# Patient Record
Sex: Male | Born: 1970 | Race: White | Hispanic: No | Marital: Single | Smoking: Current every day smoker
Health system: Southern US, Community
[De-identification: ages and names within clinical notes are randomized; demographics above are authoritative.]

## PROBLEM LIST (undated history)

## (undated) DIAGNOSIS — T7840XA Allergy, unspecified, initial encounter: Secondary | ICD-10-CM

## (undated) DIAGNOSIS — M1990 Unspecified osteoarthritis, unspecified site: Secondary | ICD-10-CM

## (undated) HISTORY — DX: Unspecified osteoarthritis, unspecified site: M19.90

## (undated) HISTORY — PX: APPENDECTOMY: SHX54

## (undated) HISTORY — DX: Allergy, unspecified, initial encounter: T78.40XA

## (undated) HISTORY — PX: CHOLECYSTECTOMY: SHX55

## (undated) HISTORY — PX: HIATAL HERNIA REPAIR: SHX195

---

## 2004-10-01 ENCOUNTER — Ambulatory Visit: Payer: Self-pay | Admitting: Internal Medicine

## 2005-01-02 ENCOUNTER — Ambulatory Visit: Payer: Self-pay | Admitting: Internal Medicine

## 2007-12-02 ENCOUNTER — Ambulatory Visit: Payer: Self-pay | Admitting: Gastroenterology

## 2007-12-02 LAB — CONVERTED CEMR LAB
ALT: 71 units/L — ABNORMAL HIGH (ref 0–53)
Basophils Absolute: 0.1 10*3/uL (ref 0.0–0.1)
Bilirubin, Direct: 0.1 mg/dL (ref 0.0–0.3)
CO2: 34 meq/L — ABNORMAL HIGH (ref 19–32)
Calcium: 10.3 mg/dL (ref 8.4–10.5)
Eosinophils Absolute: 0.6 10*3/uL (ref 0.0–0.6)
GFR calc Af Amer: 97 mL/min
Glucose, Bld: 104 mg/dL — ABNORMAL HIGH (ref 70–99)
HCT: 47.4 % (ref 39.0–52.0)
Hemoglobin: 16.3 g/dL (ref 13.0–17.0)
Lymphocytes Relative: 28.9 % (ref 12.0–46.0)
MCHC: 34.3 g/dL (ref 30.0–36.0)
MCV: 86.9 fL (ref 78.0–100.0)
Monocytes Absolute: 0.8 10*3/uL — ABNORMAL HIGH (ref 0.2–0.7)
Neutro Abs: 4.3 10*3/uL (ref 1.4–7.7)
Neutrophils Relative %: 52.5 % (ref 43.0–77.0)
Potassium: 4.6 meq/L (ref 3.5–5.1)
Saturation Ratios: 18.4 % — ABNORMAL LOW (ref 20.0–50.0)
Sodium: 143 meq/L (ref 135–145)
Total Protein: 7.4 g/dL (ref 6.0–8.3)

## 2007-12-08 ENCOUNTER — Ambulatory Visit: Payer: Self-pay | Admitting: Gastroenterology

## 2007-12-08 LAB — CONVERTED CEMR LAB: Hepatitis B Surface Ag: NEGATIVE

## 2007-12-14 ENCOUNTER — Ambulatory Visit: Payer: Self-pay | Admitting: Gastroenterology

## 2007-12-14 ENCOUNTER — Encounter: Payer: Self-pay | Admitting: Gastroenterology

## 2007-12-14 DIAGNOSIS — K449 Diaphragmatic hernia without obstruction or gangrene: Secondary | ICD-10-CM | POA: Insufficient documentation

## 2007-12-14 DIAGNOSIS — K210 Gastro-esophageal reflux disease with esophagitis: Secondary | ICD-10-CM

## 2007-12-14 LAB — CONVERTED CEMR LAB
Cholesterol: 212 mg/dL (ref 0–200)
HDL: 26.6 mg/dL — ABNORMAL LOW (ref >39.0)
VLDL: 34 mg/dL (ref 0–40)

## 2007-12-17 ENCOUNTER — Emergency Department (HOSPITAL_COMMUNITY): Admission: EM | Admit: 2007-12-17 | Discharge: 2007-12-17 | Payer: Self-pay | Admitting: Emergency Medicine

## 2007-12-19 ENCOUNTER — Ambulatory Visit (HOSPITAL_COMMUNITY): Admission: RE | Admit: 2007-12-19 | Discharge: 2007-12-19 | Payer: Self-pay | Admitting: Gastroenterology

## 2007-12-30 ENCOUNTER — Ambulatory Visit: Payer: Self-pay | Admitting: Gastroenterology

## 2007-12-30 LAB — CONVERTED CEMR LAB
ALT: 53 units/L (ref 0–53)
AST: 27 units/L (ref 0–37)
Bilirubin, Direct: 0.1 mg/dL (ref 0.0–0.3)
Total Protein: 6.8 g/dL (ref 6.0–8.3)

## 2008-01-12 ENCOUNTER — Ambulatory Visit: Payer: Self-pay | Admitting: Gastroenterology

## 2008-03-06 DIAGNOSIS — E7800 Pure hypercholesterolemia, unspecified: Secondary | ICD-10-CM

## 2008-03-06 DIAGNOSIS — K828 Other specified diseases of gallbladder: Secondary | ICD-10-CM

## 2008-03-06 DIAGNOSIS — K7689 Other specified diseases of liver: Secondary | ICD-10-CM

## 2008-11-10 IMAGING — CR DG KNEE COMPLETE 4+V*L*
6 series · 6 of 6 positions shown · non-contrast
Comparison: none

CLINICAL DATA: Pain.   
 LEFT KNEE ? 4 VIEW:

[t knee ap left *]
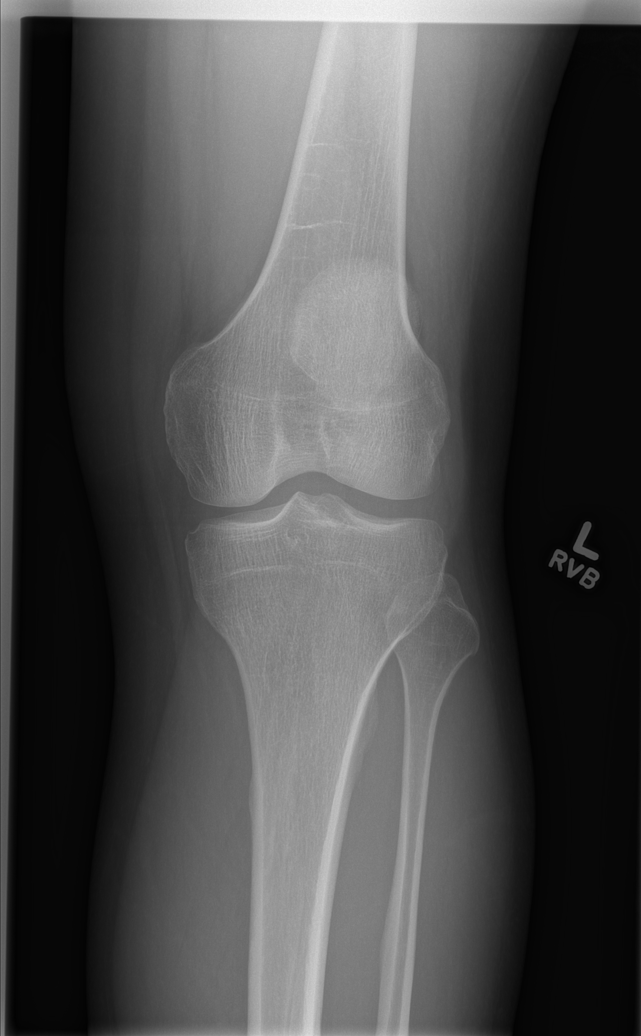

[t knee oblique left * (1 of 3)]
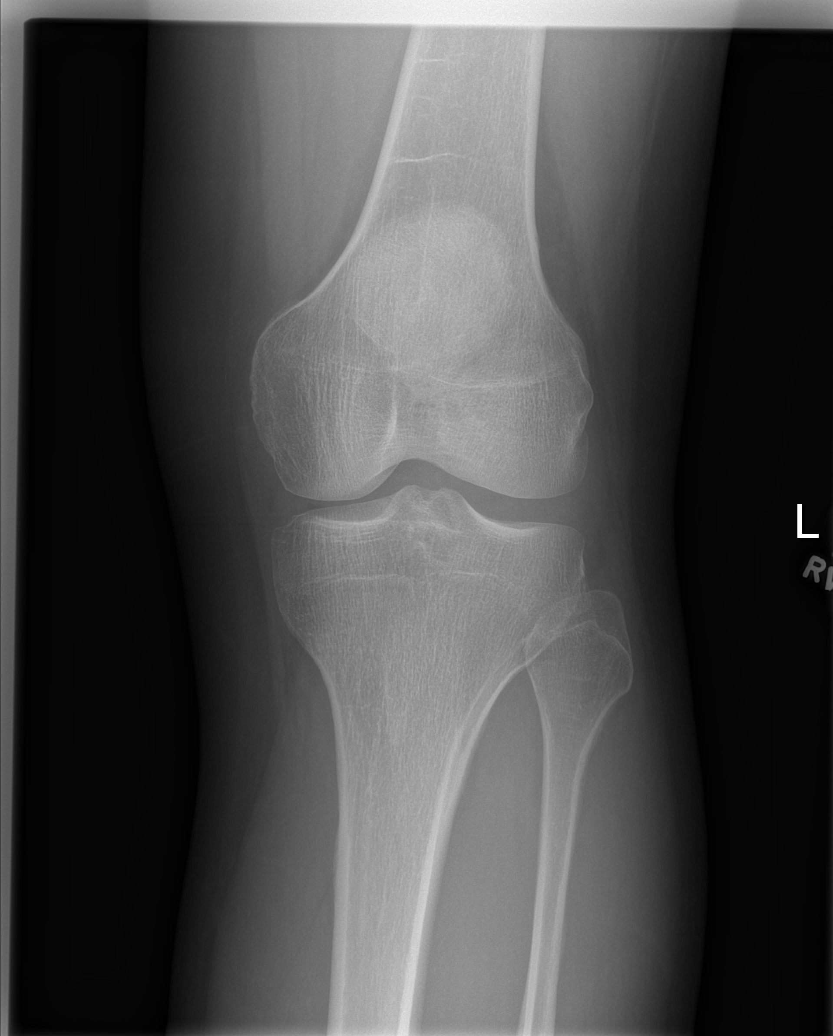

[t knee oblique left * (2 of 3)]
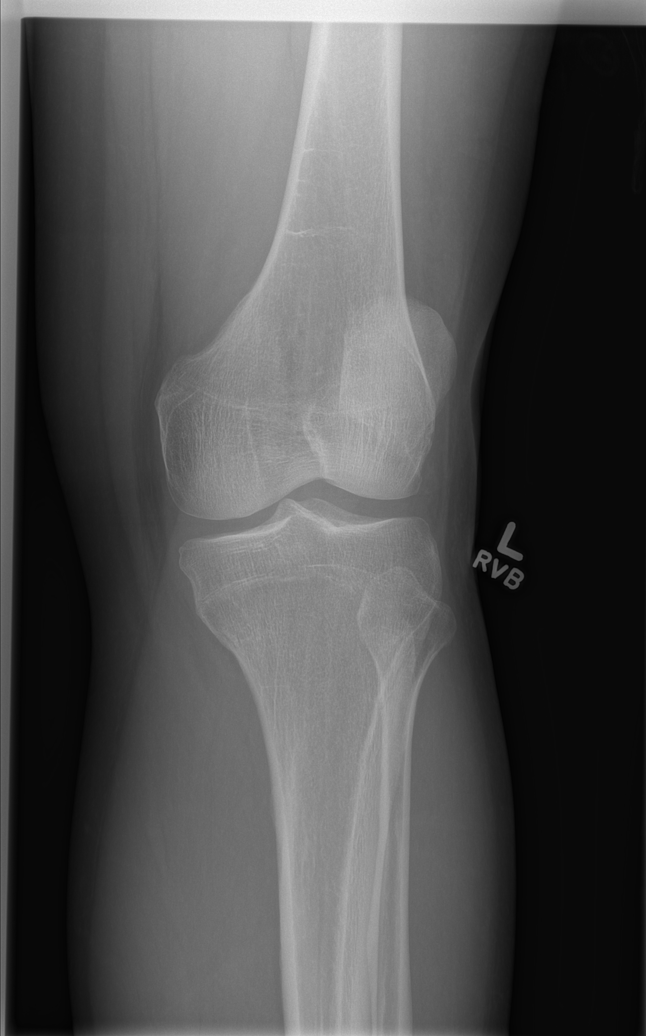

[t knee oblique left * (3 of 3)]
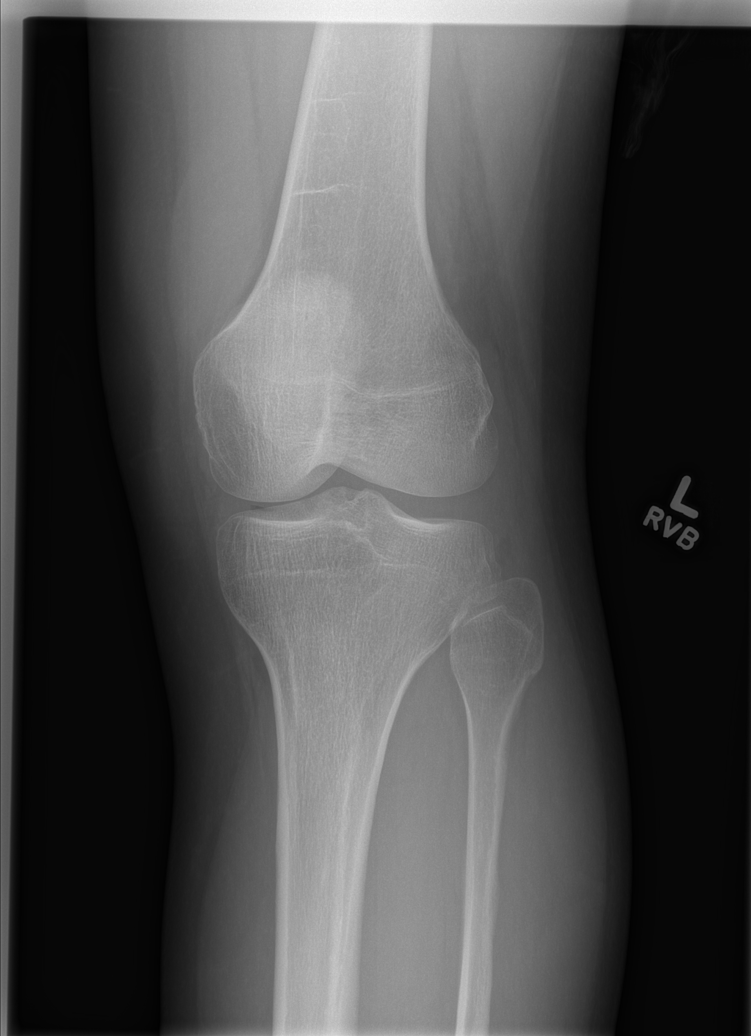

[t knee lat left *]
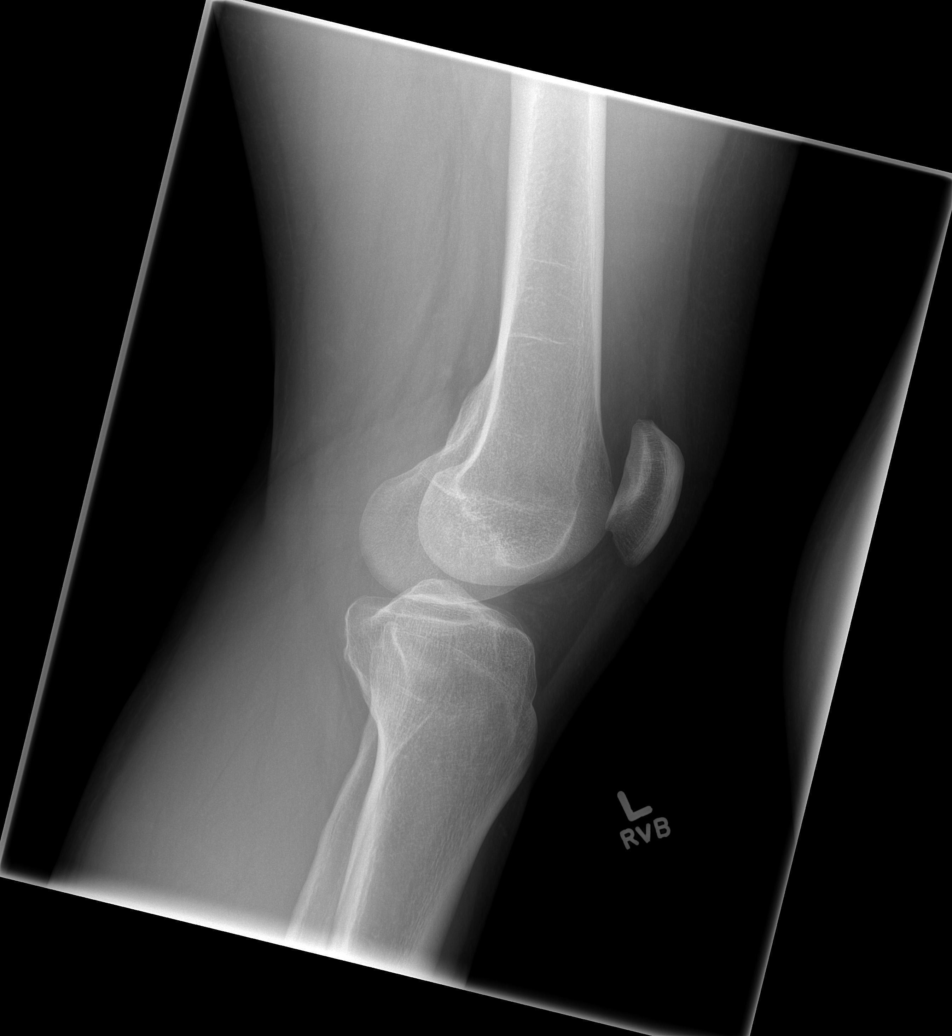

[t knee tunnel view left *]
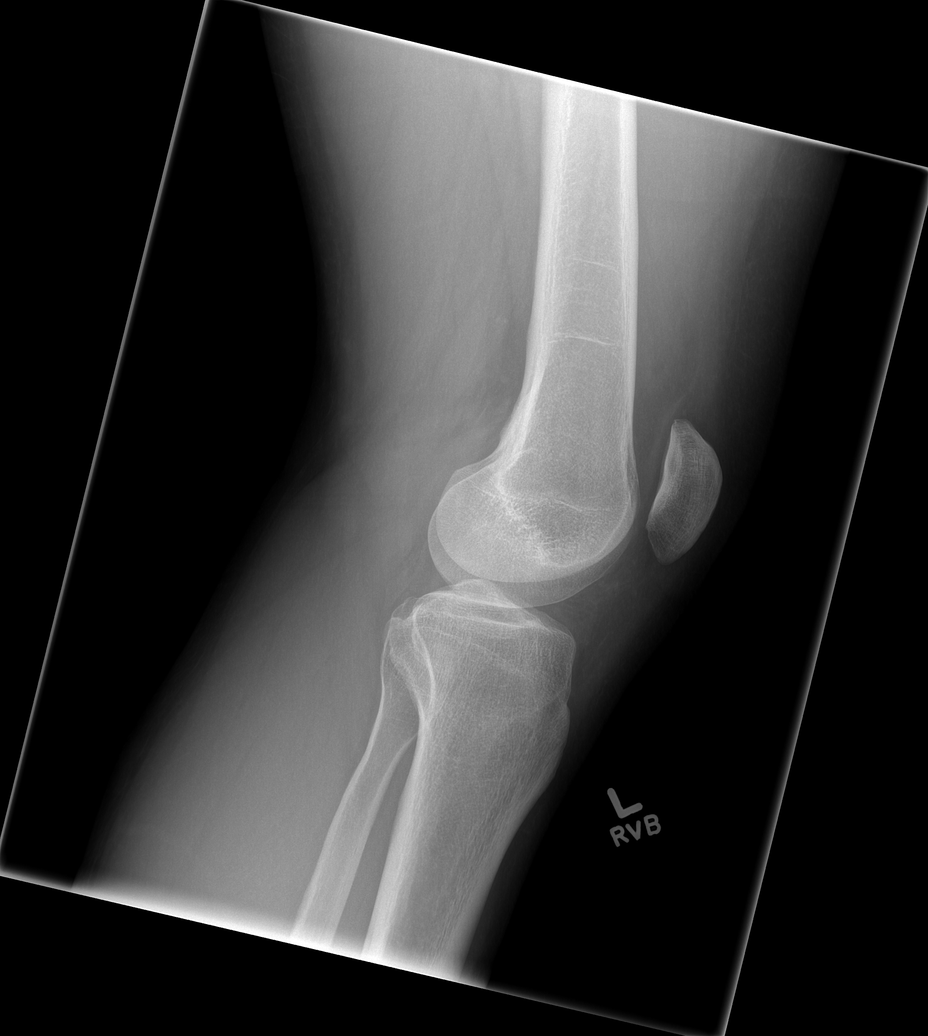

[6 of 6 positions shown; findings below may reference images not displayed]

FINDINGS: There is no fracture or joint effusion.  No notable degenerative disease.  Soft tissue structures appear normal.
IMPRESSION: Negative study.

## 2008-11-12 IMAGING — US US ABDOMEN COMPLETE
1 series · 13 of 25 positions shown · non-contrast
Comparison: None.

CLINICAL DATA: Chest pain.  Assess for fatty liver.   
 ABDOMEN ULTRASOUND:
TECHNIQUE: Complete abdominal ultrasound examination was performed including evaluation of the liver, gallbladder, bile ducts, pancreas, kidneys, spleen, IVC, and abdominal aorta.

[Series 1: unknown · 0.35mm/px · 13 of 78 slices shown]
[im 1/78]
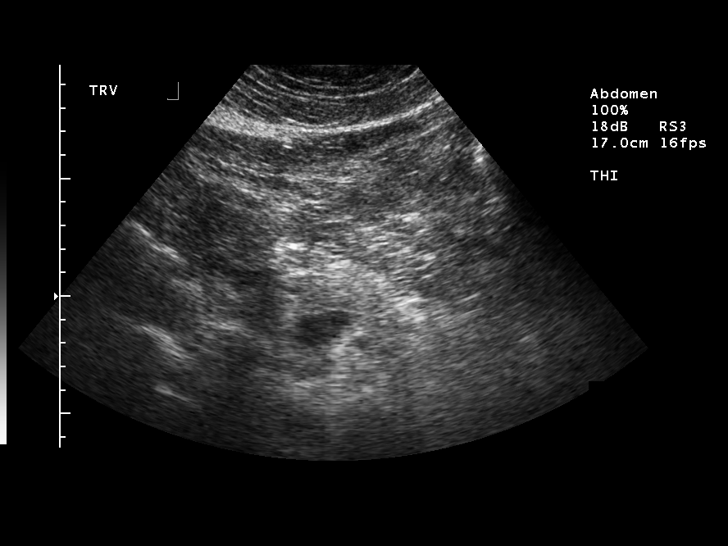
[im 7/78]
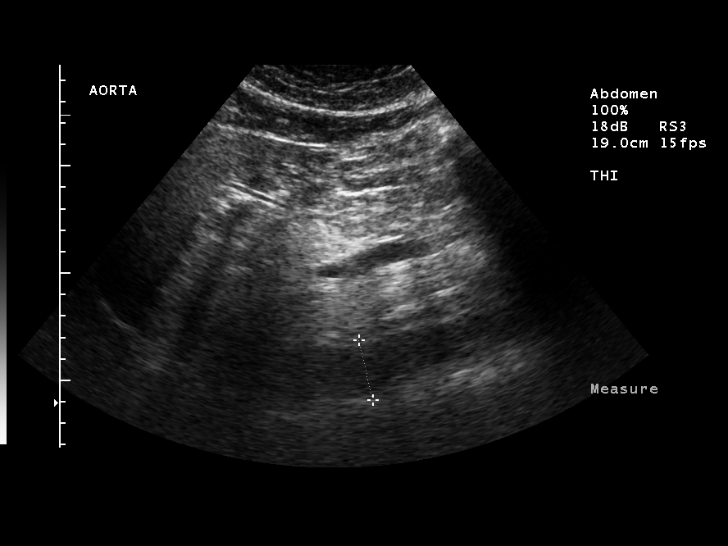
[im 13/78]
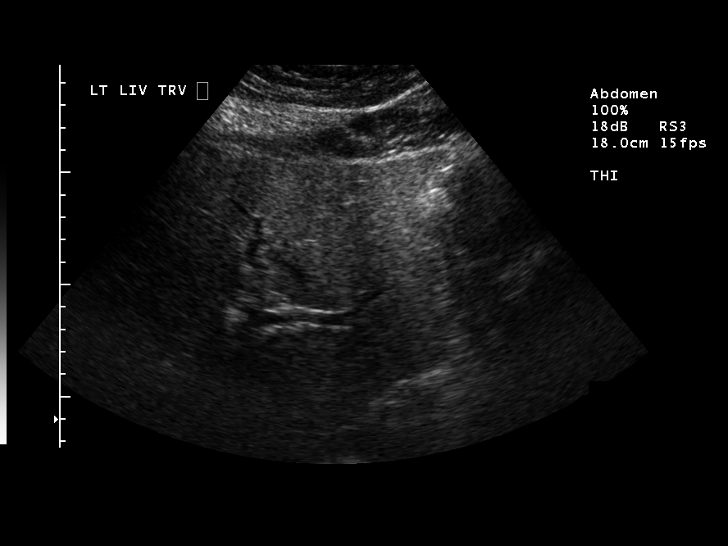
[im 20/78]
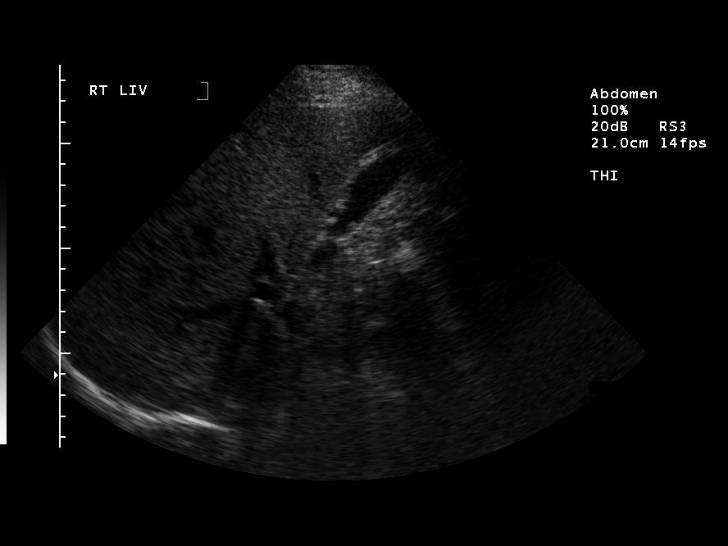
[im 26/78]
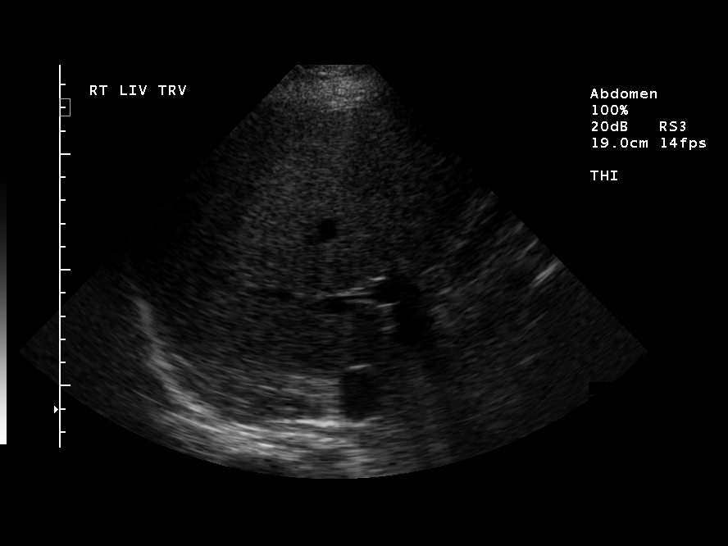
[im 33/78]
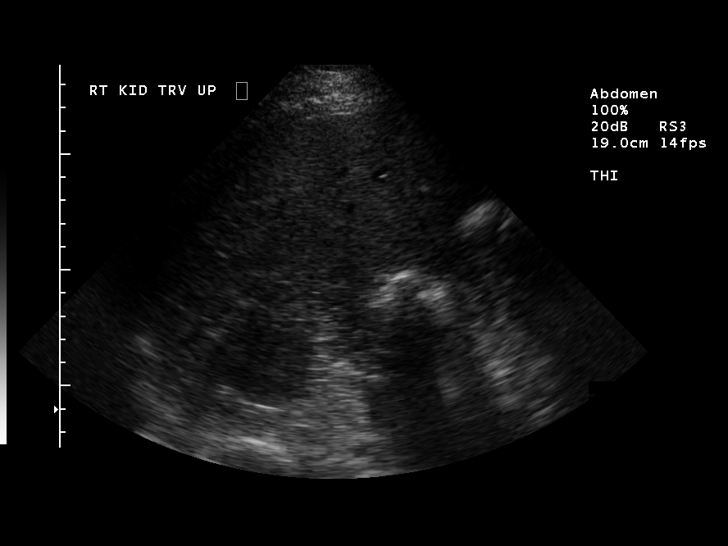
[im 39/78]
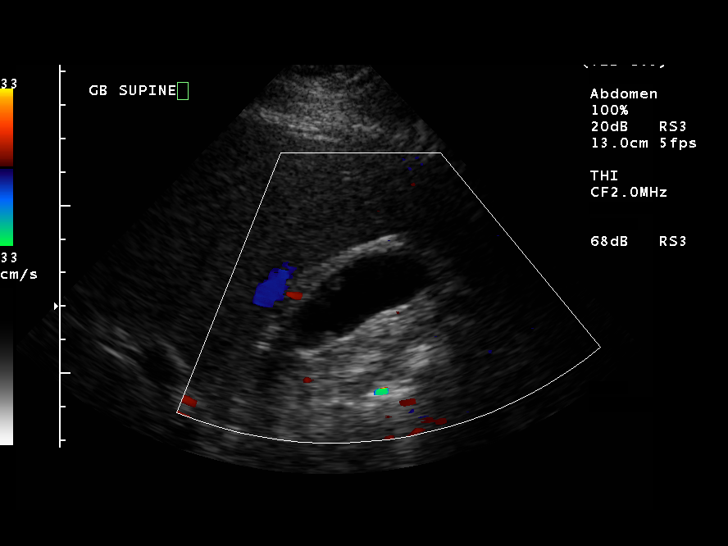
[im 45/78]
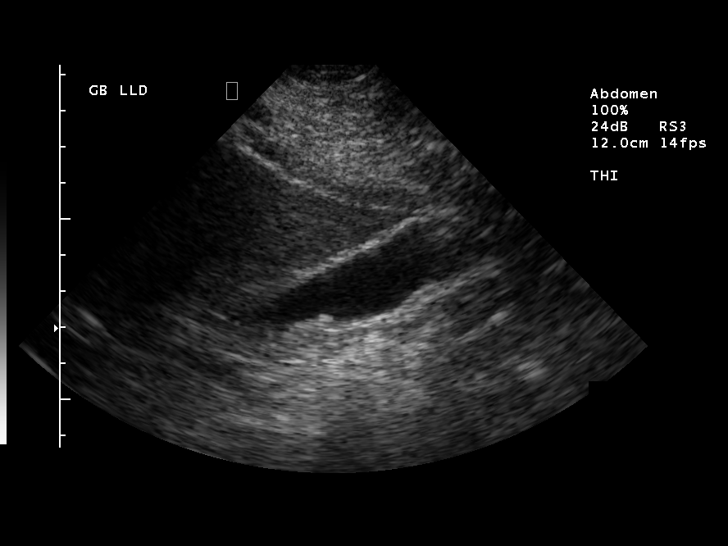
[im 52/78]
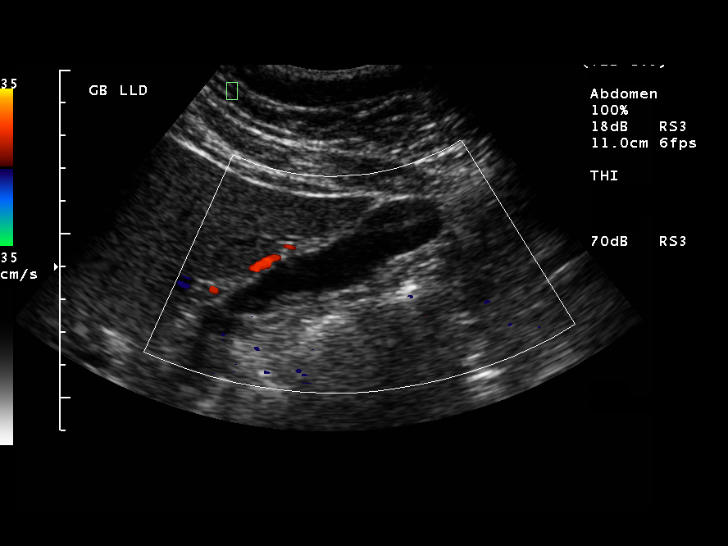
[im 58/78]
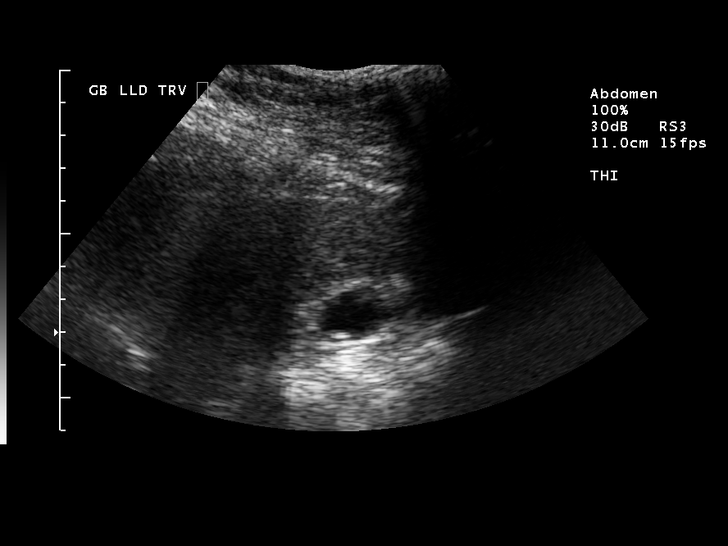
[im 65/78]
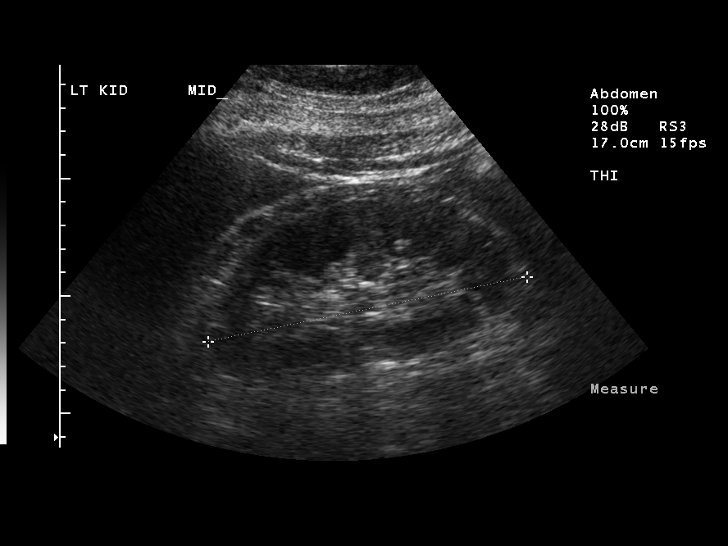
[im 71/78]
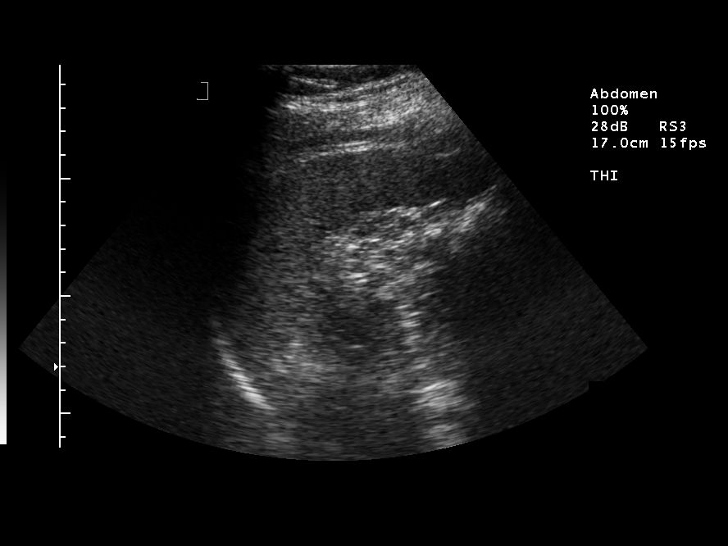
[im 78/78]
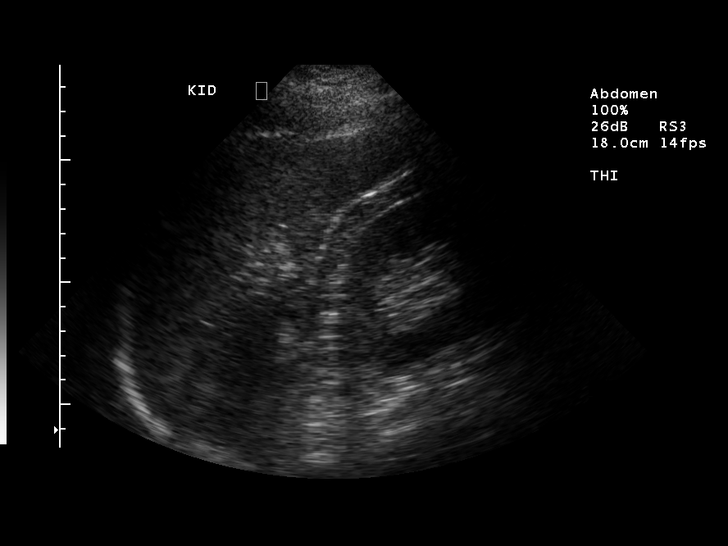

[13 of 25 positions shown; findings below may reference images not displayed]

FINDINGS: The liver shows slightly increased echogenicity suggesting mild fatty change.  This is not advanced.  No focal lesions or biliary ductal dilatation.  Common duct is 4 mm.  The gallbladder is markedly abnormal.  There are multiple gallbladder polyps.  There are some small mobile echogenic structures without definite shadowing.  These could represent small non-shadowing stones or sludge balls.  No sonographic Murphy?s sign.   The spleen is normal.  The pancreas is normal, though the tail is not well seen.  The aorta and IVC are normal.  Both kidneys are normal with the right measuring 13 cm and the left measuring 13.8 cm.  No ascites.
IMPRESSION: 1.  Slightly echogenic liver suggesting mild fatty change. 
 2.  Abnormal gallbladder with multiple polyps and some mobile echogenic entities within the gallbladder that could be small non-shadowing stones or sludge balls.  No wall thickening, pericholecystic fluid, sonographic Murphy?s sign or biliary ductal dilatation however.

## 2009-03-06 ENCOUNTER — Telehealth: Payer: Self-pay | Admitting: Gastroenterology

## 2009-04-02 ENCOUNTER — Ambulatory Visit (HOSPITAL_COMMUNITY): Admission: RE | Admit: 2009-04-02 | Discharge: 2009-04-02 | Payer: Self-pay | Admitting: Gastroenterology

## 2009-04-09 ENCOUNTER — Ambulatory Visit: Payer: Self-pay | Admitting: Gastroenterology

## 2009-04-09 DIAGNOSIS — K219 Gastro-esophageal reflux disease without esophagitis: Secondary | ICD-10-CM

## 2009-04-09 DIAGNOSIS — K8050 Calculus of bile duct without cholangitis or cholecystitis without obstruction: Secondary | ICD-10-CM | POA: Insufficient documentation

## 2009-04-09 LAB — CONVERTED CEMR LAB: Albumin: 3.8 g/dL (ref 3.5–5.2)

## 2009-04-23 ENCOUNTER — Encounter: Payer: Self-pay | Admitting: Gastroenterology

## 2009-06-20 ENCOUNTER — Encounter (INDEPENDENT_AMBULATORY_CARE_PROVIDER_SITE_OTHER): Payer: Self-pay | Admitting: Surgery

## 2009-06-20 ENCOUNTER — Ambulatory Visit (HOSPITAL_COMMUNITY): Admission: RE | Admit: 2009-06-20 | Discharge: 2009-06-21 | Payer: Self-pay | Admitting: Surgery

## 2009-07-09 ENCOUNTER — Encounter: Payer: Self-pay | Admitting: Gastroenterology

## 2009-07-30 ENCOUNTER — Encounter: Payer: Self-pay | Admitting: Gastroenterology

## 2010-01-16 ENCOUNTER — Encounter (INDEPENDENT_AMBULATORY_CARE_PROVIDER_SITE_OTHER): Payer: Self-pay | Admitting: Surgery

## 2010-01-16 ENCOUNTER — Ambulatory Visit (HOSPITAL_COMMUNITY): Admission: EM | Admit: 2010-01-16 | Discharge: 2010-01-16 | Payer: Self-pay | Admitting: Emergency Medicine

## 2010-01-16 ENCOUNTER — Encounter: Payer: Self-pay | Admitting: Emergency Medicine

## 2010-01-16 ENCOUNTER — Ambulatory Visit: Payer: Self-pay | Admitting: Diagnostic Radiology

## 2010-11-09 ENCOUNTER — Encounter: Payer: Self-pay | Admitting: Gastroenterology

## 2011-01-12 LAB — DIFFERENTIAL
Basophils Relative: 1 % (ref 0–1)
Eosinophils Absolute: 0.3 10*3/uL (ref 0.0–0.7)
Eosinophils Relative: 2 % (ref 0–5)
Monocytes Relative: 5 % (ref 3–12)
Neutrophils Relative %: 76 % (ref 43–77)

## 2011-01-12 LAB — COMPREHENSIVE METABOLIC PANEL
ALT: 39 U/L (ref 0–53)
AST: 24 U/L (ref 0–37)
Alkaline Phosphatase: 85 U/L (ref 39–117)
CO2: 25 mEq/L (ref 19–32)
GFR calc non Af Amer: >60 mL/min (ref >60)
Glucose, Bld: 144 mg/dL — ABNORMAL HIGH (ref 70–99)
Potassium: 4.1 mEq/L (ref 3.5–5.1)
Sodium: 143 mEq/L (ref 135–145)
Total Protein: 7 g/dL (ref 6.0–8.3)

## 2011-01-12 LAB — CBC
Hemoglobin: 15 g/dL (ref 13.0–17.0)
RBC: 5.07 MIL/uL (ref 4.22–5.81)

## 2011-01-12 LAB — URINALYSIS, ROUTINE W REFLEX MICROSCOPIC
Nitrite: NEGATIVE
Specific Gravity, Urine: 1.029 (ref 1.005–1.030)
pH: 7.5 (ref 5.0–8.0)

## 2011-01-23 LAB — CBC
Hemoglobin: 15.3 g/dL (ref 13.0–17.0)
MCHC: 34.8 g/dL (ref 30.0–36.0)
MCV: 88.3 fL (ref 78.0–100.0)
RDW: 13 % (ref 11.5–15.5)

## 2011-01-23 LAB — CREATININE, SERUM: Creatinine, Ser: 0.91 mg/dL (ref 0.4–1.5)

## 2011-01-23 LAB — HEPATIC FUNCTION PANEL
ALT: 1017 U/L — ABNORMAL HIGH (ref 0–53)
AST: 599 U/L — ABNORMAL HIGH (ref 0–37)
Albumin: 3.6 g/dL (ref 3.5–5.2)
Alkaline Phosphatase: 72 U/L (ref 39–117)
Bilirubin, Direct: 0.2 mg/dL (ref 0.0–0.3)
Total Bilirubin: 0.8 mg/dL (ref 0.3–1.2)

## 2011-01-23 LAB — HEMOGLOBIN AND HEMATOCRIT, BLOOD: Hemoglobin: 15.8 g/dL (ref 13.0–17.0)

## 2011-03-03 NOTE — Op Note (Signed)
NAMETRAVAS, SCHEXNAYDER NO.:  [id number]   MEDICAL RECORD NO.:  [id number]          PATIENT TYPE:  OIB   LOCATION:  1525                         FACILITY:  [facility]   PHYSICIAN:  Ardeth Sportsman, MD     DATE OF BIRTH:  [birth year]   DATE OF PROCEDURE:  06/20/2009  DATE OF DISCHARGE:                               OPERATIVE REPORT   PRIMARY CARE PHYSICIAN:  C. Duane Lope, M.D.   GASTROENTEROLOGIST:  Vania Rea. Jarold Motto, MD, Carla Drape, [facility].   SURGEON:  Ardeth Sportsman, MD.   ASSISTANT:  Adolph Pollack, M.D.   PREOPERATIVE DIAGNOSES:  1. Sliding paraesophageal hiatal hernia.  2. Gastroesophageal reflux disease.  3. Chronic cholecystitis with gallstones.   POSTOPERATIVE DIAGNOSES:  1. Sliding paraesophageal hiatal hernia.  2. Gastroesophageal reflux disease.  3. Chronic cholecystitis with gallstones.   PROCEDURES PERFORMED:  1. Laparoscopic Paraesophageal hiatal hernia primary repair with      pledgets.  2. Laparoscopic Nissen fundoplication over a 56-French bougie, x2 cm.  3. Laparoscopic cholecystectomy with intraoperative cholangiogram.   ANESTHESIA:  1. General anesthesia.  2. Local anesthetic with field block on all port sites.   SPECIMENS:  Gallbladder.   DRAINS:  A 19-French Blake drain goes up into the anterior mediastinum  and then runs along the lesser curve of the stomach and porta hepatis,  before it exits out the right upper quadrant.   ESTIMATED BLOOD LOSS:  75 mL.   COMPLICATIONS:  None apparent.   INDICATIONS:  [name] is a 40 year old obese male who has been  struggling with reflux symptoms for several years.  He has been on  medications and he wishes to get  off this medicine, as it is bothersome  to him.  He had evaluation with a known paraesophageal hiatal hernia, a  sliding type.  He also had some abdominal pain and was found to have  some gallstones as well, with history of intermittent biliary  colic as  well.  Otherwise, he has pretty good exercise tolerance and the rest of  the differential diagnosis was negative.   The anatomy and physiology of the digestive tract was explained.  Pathophysiology of reflux disease was discussed.  Options were discussed  and recommendations made for laparoscopic paraesophageal hiatal hernia  repair, possibly with absorbable mesh and Nissen fundoplication -- given  the fact he had good manometry.  The risks, benefits and alternatives  were discussed.  Questions answered and he agreed to proceed.   I also discussed the anatomy and physiology of hepatobiliary and  pancreatic function.  Pathophysiology of chronic cholecystitis and the  presence of gallstones, and natural histories were discussed.  Options  discussed.  Recommendation was made for laparoscopic cholecystectomy  with intraoperative cholangiogram.  Similar risks, benefits and  alternatives were discussed.  Questions were answered and he and family  agreed to proceed.   OPERATIVE FINDINGS:  He had a  posterior sliding-type paraesophageal  hiatal hernia.  He actually had good crura at the approximation  anteriorly, with a pretty good crura overall.  He had no  intra-abdominal  esophagus.   He had some gallbladder wall thickening, especially around the  infundibulum; but a normal cholangiogram with gallstones.  He did not  have any evidence of any fatty steatohepatitis or any major adhesions.   DESCRIPTION OF PROCEDURE:  Informed consent was confirmed.  The patient  received IV antibiotics just prior to surgery.  He had sequential  pressure devices active during the entire case.  He underwent general  esthesia without any difficulty.  He was positioned on a split leg with  arms tucked.  His abdomen was clipped, prepped and draped in sterile  fashion.  Surgical time-out confirmed our plan.   I placed a #5-0 port in the left upper quadrant, with patient in steep  reverse Trendelenburg  and left-side up, using optical entry technique.  Camera inspection revealed no intra-abdominal injury.  Under  insufflation, I placed 5 more ports in the right paramedian, right upper  quadrant, and left subxiphoid region.  The left subxiphoid was removed  and an Omega shaped liver retractor was used to lift the very large left  lateral sector of the liver anteriorly, to expose the esophageal hiatus.  I placed a 10 mm port on the left subcostal ridge near the anterior  axillary line.   I started with the paraesophageal dissection.  I opened up the  hepatogastric ligament along the lesser curvature of the stomach, and  followed that up to find the right crus and identified it anteriorly.  I  entered into the anterior mediastinum bluntly without any difficulty.  With this, I could define the medial edge of the right crus.  I followed  it down posteriorly and freed it off with blunt and ultrasonic  dissection, until I found the base of the crura.  With that, I could  help see the left crus.  I freed off some of the pharyngoesophageal  attachments coming from the medial side at the left posterior crus more  anteriorly.  I also with that could get to start to see a posterior  window.  However, the cardia of the stomach was densely adherent to his  diaphragm on the left side; it was thick, with a lot of visceral fat.   Therefore, I decided to go ahead and start taking the short gastric  vessels.  I rolled the mid stomach over to the right side, and ligated  the short gastric vessels -- starting at the mid part of the greater  curvature of stomach and following up along the cardia.  I rolled the  stomach towards the right side, and then freed off attachment to the  retroperitoneum as well.  The cardia was densely adherent to the spleen;  very careful and meticulous dissection was able to free that off.  At  one point I did encounter a larger posterior branch off the superior  pole the of the  spleen.  I controlled it with some clips, with excellent  hemostasis.  I was able to finally free the cardia off and peel it off  the diaphragm, until I was able to meet up with the left crus.  I freed  off the remaining pharyngoesophageal attachments off the left crus.   With that, I then started doing a type 2 mediastinal dissection; freeing  off lateral and then interior attachments to the esophagus, as well as  some good posterior dissection.  With that, I was able to get about 5 cm  of intra-abdominal esophageal length off tension.  I dissected around the cardia of the stomach at the esophagogastric  junction.  He had some large epiphrenic fat pads, and I freed the  anterior one as well as a little bit of the posterior one, taking care  to protect the vagus nerves.  With that, I brought the cardia of the  stomach behind the esophagus to set up the right side of the  fundoplication wrap.  I did the classic shoeshine maneuver.  The wrap  laid well, smooth and flat.  We rolled the right side of the wrap over  to expose the esophageal hiatus.   I closed the esophageal hiatus using #0 Ethibond horizontal mattress  sutures, with 1x2 cm pledgets on each side.  This was done with 3  interrupted stitches.  This helped approximate the crura well.  He had  good healthy crura.  It was not a giant paraesophageal hernia.  I  therefore elected to not use any mesh, since I had good intra-abdominal  esophageal length and strong crurae.   I did a posterior gastropexy by taking #1 Ethibond stitches to the  posterior side of the right side of the stomach wrap to the crura, for a  good posterior gastropexy x2.  I did a left anterior gastropexy by  taking a stitch of 0 Ethibond through the left superior part of the apex  of the crus, to the superior part of the left side of the wrap and tied  that down.  This helped set up the wrap for fundoplication.  I did 2  internal esophagogastric plication  stitches (Guarner stitches).  I did  that by taking a bite of the medial part of the left side of the wrap,  to the lateral part of the intra-abdominal esophagus.  I did one in a  mirror image fashion on the right side.  This helped hold the wrap  internally as well.   Anesthesia had passed a 56-French lit bougie transorally, while I placed  the stomach on axial tension.  I did a classic 3-stitch Nissen  fundoplication, taking a bite of the anterior part of the left wrap,  anterior esophagus, and anterior right wrap and tied it down.  The 3  stitches measured a length of 2 cm.  The bougie was removed intact.  I  could easily pass a grasper underneath the wrap, as well as up into the  anterior mediastinum; there was good snap closure.  I did copious  irrigation.  Hemostasis was excellent.   Next, I turned attention over to the cholecystectomy.  I used ultrasonic  dissection to free the peritoneal coverings to the gallbladder and liver  on the anteromedial side and then the posterolateral sides.  He had a  lot of thickening.  With this, I initially freed the proximal half of  the gallbladder off.  I started skeletonizing around, what I felt was,  the infundibulum.  Then the anterior and posterior cystic artery  branches were carefully isolated, seen and ligated close up on the  gallbladder side using ultrasonic dissection.   Over time I was able to find the esophagus as it tapered down, and came  down to what I felt was probably the cystic duct.  However, the cystic  duct seemed rather thickened.  I ended up completely freeing the rest of  the gallbladder off in the dome-down fashion, such that the gallbladder  was only attached at the cystic duct.  I did careful and meticulous  skeletonization  of that.  During dissection; however, there was a tear  on the posterior wall of the gallbladder; but this was high up away from  the porta hepatis.   There was some dense fat and some  inflammation down in the infundibulum,  and I decided to go ahead and proceed with cholangiogram.  I placed a  few clips on the gallbladder side.  Partial cystotomy was performed.  I  took a Tree surgeon and passed it down into, what I  thought was at, the infundibular/cystic duct junction.  <   A cholangiogram was run using dilute radiopaque contrast and continuous  fluoroscopy.  Contrast refluxed back up into the gallbladder well.  Contrast flowed down a helical side branch, consistent with  cannulization (probably more at the infundibulum than the cystic duct).  It was a long cystic duct.  Contrast went into the common hepatic duct  and refluxed into the right and left intrahepatic chains.  It also went  down more distally.  I did a rerun to look at the ampulla, and he had a  normal, smooth ampulla.  Contrast easily flowed into the duodenum.  There was no filling defects or abnormalities.  There was a little wisp  of a leak, but with careful inspection could see it was coming purely  from the catheter itself.  One could see that I had catheterized really  more of the infundibulum.   I removed the cholangiocatheter.  I further skeletonized just a little  more proximally.  However, because there was a lot of inflammation and I  felt like he could get into more trouble; so therefore I ligated at the  infundibular/cystic duct junction.  I used 0 PDS Endo loops x2, as well  as a few clips just to help mark the area as well.  I transected the  gallbladder and placed it in an EndoCatch bag.  I removed it out with a  gentle dilation on the 10 mm port in the left upper quadrant.  I  reapproximated the fascia using 0 Vicryl stitch (using a laparoscopic  suture passer under direct visualization).   I did copious irrigation and meticulous inspection was made.  Hemostasis  was excellent; there was no leak of bile.  The cystic stump seemed well  ligated.  I placed a drain, as noted  above.  The Nissen liver retractor  had been removed prior to the cholangiogram under direct visualization  as well.  Hemostasis was excellent.  There was clear return after  irrigation, and the splenic area was clear as well.  The capnoperitoneum  was evacuated.  Ports removed.  The fascial stitch was tied down at the  10mm port site.  Skin was closed using 4-0 Monocryl stitch.  The drain  was held using a 2-0 Nylon stitch.  Sterile dressings were applied.   The patient was extubated and taken to the Recovery Room in stable  condition.  I discussed postoperative care with the patient in detail in  the Clinic a few weeks ago,  and then again just prior to surgery.  I  discussed it with his partner as well in detail.  The partner expressed  understanding and appreciation.      Ardeth Sportsman, MD  Electronically Signed     SCG/MEDQ  D:  06/20/2009  T:  06/21/2009  Job:  [id number]   cc:   C. Duane Lope, M.D.  Fax: [phone]   Vania Rea. Jarold Motto, MD, Caleen Essex,  FAGA  [address]  [place]  [place] [address]

## 2011-03-03 NOTE — Assessment & Plan Note (Signed)
[facility]                         GASTROENTEROLOGY OFFICE NOTE   NAME:Hollingshead, BURLEIGH BROCKMANN                     MRN:          [id number]  DATE:12/02/2007                            DOB:          05-Jul-1971    Mr. 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   Mr. Isbell has had acid reflux for at least three to four years and  he takes frequent antacids.  He describes regurgitation and burning  substernal chest pain without dysphagia or any hepatobiliary problems.  His appetite is good and his weight is stable.  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  His reflux seems to be  getting worse.  He is having rather marked regurgitation, especially at  night.  He denies Raynaud's phenomenon or other symptoms of collagen  vascular disease.  He has not had previous barium studies or endoscopic  exams.  He follows a fairly regular diet.  He does not abuse caffeine,  chocolate, or fried foods.  However, he has alternating diarrhea and  constipation without melena, hematochezia or crampy lower abdominal  pain.  He has not had recent laboratory data performed.  He sees Dr. Vianne Bulls for primary care.   PAST MEDICAL HISTORY:  His past medical history is remarkable for  borderline hypercholesterolemia, but otherwise he is in good health,  without chronic medical problems.   MEDICATIONS:  Multivitamins, glucosamine, pantothenic acid daily, two  Daypro a day and p.r.n. Chantix for smoking cessation.   He denies drug allergies.   FAMILY HISTORY:  Noncontributory.  He thinks his grandmother may have  had colon carcinoma in her 74s or 14s.   SOCIAL HISTORY:  The patient works in Patent examiner.  He quit  smoking in November, after smoking for at least 20 years.  He uses three  to four drinks of ethanol a week, but denies ethanol abuse or  dependency.  He has not used intravenous drugs.   REVIEW OF SYSTEMS:  Noncontributory.  He has just gotten over strep  throat approximately a month ago.  He denies any specific  cardiovascular, pulmonary, genitourinary, neurologic, endocrine or  neuropsychiatric problems.  He does have known degenerative arthritis in  his knees and this runs in his family.   EXAM:  He is a healthy-appearing white male, in no acute distress,  appearing his stated age.  He is 6 feet 5 and weighs 304 pounds.  Blood pressure is 114/72 and  pulse was 76 and regular.  I could not appreciate stigmata of chronic liver disease or thyromegaly.  His chest was clear.  He was in a regular rhythm without murmurs, gallops or rubs.  There was no hepatosplenomegaly, abdominal masses or tenderness.  Bowel  sounds were normal.  Rectal exam was deferred.  Peripheral extremities were unremarkable without edema or phlebitis.  I  could not appreciate swollen joints or effusions.  His mental status was clear.   ASSESSMENT:  1. Chronic gastroesophageal reflux disease, rule out Barrett's mucosa.  2. 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  3. 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  Rule      out NSAID gastroduodenitis or peptic ulcer disease.  4. History of chronic cigarette abuse, currently using p.r.n. Chantix.  5. Vague family history of colon carcinoma in his grandmother.   RECOMMENDATIONS:  1. Reflux regimen explained to patient and movie shown for patient      education.  2. I have started daily PPI treatment 30 minutes before the first meal      of the day.  3. Reflux management and diet.  4. Screening laboratory parameters.  5. High-fiber diet with daily Benefiber.  6. Outpatient endoscopic exam for Barrett's  screening and to ascertain      degree of his esophagitis and degree of hiatal hernia, should      surgery in the future be contemplated.     Vania Rea. Jarold Motto, MD, Caleen Essex, FAGA  Electronically Signed    DRP/MedQ  DD: 12/02/2007  DT: 12/02/2007  Job #: [id number]   cc:   C. Duane Lope, M.D.

## 2011-03-03 NOTE — Assessment & Plan Note (Signed)
[facility]                         GASTROENTEROLOGY OFFICE NOTE   NAME:[name], [name]                     MRN:          [id number]  DATE:01/12/2008                            DOB:          [birth year]    Bruk underwent endoscopy and esophageal biopsies.  There was no evidence  of Barrett's mucosa but he did have a prominent hiatal hernia and acid  reflux changes which are doing well on Prevacid therapy along with a  daily antireflux regimen.   As part of his workup he had abnormal liver profile with an SGPT of 71.  He underwent ultrasound of the abdomen which showed an abnormal  gallbladder with multiple polyps and possible sludge balls.  He really  has had no symptoms of symptomatic cholelithiasis.  Hepatic workup  otherwise was entirely negative for any metabolic cause of liver  disease.  He was on Daypro, this was discontinued and followup liver  enzymes on December 30, 2007 were normal.   Only other medication besides Prevacid:  1. Celebrex 1-2 a day.  2. He was on Chantix for smoking cessation which he has stopped.  3. Glucosamine daily.  4. Multivitamin.   He denies use of protein supplement powders or excessive amounts of  supplemental vitamins besides a multivitamin.   There is no family history of liver disease and he had no hepatobiliary  symptomatology.   PHYSICAL EXAMINATION:  VITAL SIGNS:  He weighs 304 pounds, blood  pressure 120/84, pulse 76 and regular.  GENERAL:  Not repeated at this time.  There was no previous evidence of  hepatosplenomegaly.   ASSESSMENT:  1. Well controlled chronic gastroesophageal reflux disease on daily      proton pump inhibitor therapy.  2. Probable fatty infiltration of the liver associated with his mild      obesity.  He is 6 feet 5 inches tall.  3. Abnormal gallbladder with multiple gallbladder polyps which may      necessitate cholecystectomy.  The incidence of gallbladder cancer      in this  situation is accelerated.   RECOMMENDATIONS:  1. Continue reflux regimen and daily Prevacid.  2. Repeat ultrasound in 2 months along with repeat liver profile.  3. Consider surgical referral.  4. Continue other medications as per Dr. Tenny Craw.     Vania Rea. Jarold Motto, MD, Caleen Essex, FAGA  Electronically Signed    DRP/MedQ  DD: 01/12/2008  DT: 01/12/2008  Job #: [id number]   cc:   C. Duane Lope, M.D.

## 2012-01-27 ENCOUNTER — Encounter: Payer: Self-pay | Admitting: Gastroenterology

## 2012-08-18 ENCOUNTER — Telehealth (INDEPENDENT_AMBULATORY_CARE_PROVIDER_SITE_OTHER): Payer: Self-pay

## 2012-08-18 ENCOUNTER — Telehealth (INDEPENDENT_AMBULATORY_CARE_PROVIDER_SITE_OTHER): Payer: Self-pay | Admitting: General Surgery

## 2012-08-18 DIAGNOSIS — R110 Nausea: Secondary | ICD-10-CM

## 2012-08-18 NOTE — Telephone Encounter (Signed)
Dr Gross please advise.

## 2012-08-18 NOTE — Telephone Encounter (Signed)
Pt called requesting refill on phenergan. Pt advised he has not been seen by our office since 01-16-10 and in system He has seen Dr Jarold Motto this year. Pt advised he should contact his GI MD Dr Jarold Motto for refill requests.

## 2012-08-18 NOTE — Telephone Encounter (Signed)
Message copied by Liliana Cline on Thu Aug 18, 2012  3:56 PM ------      Message from: Larry Sierras      Created: Thu Aug 18, 2012  3:38 PM      Regarding: RX      Contact: [phone]       Landry Lookingbill/PLEASE CALL [facility] FOR PHENERGAN FROM DR GROSS/GIVEN PREVIOUSLY Molli Barrows OOT/WILL BE GLAD TO MAKE APPT WHEN  HE RETURNS IF NEEDED.  GM

## 2012-08-19 MED ORDER — PROMETHAZINE HCL 25 MG RE SUPP: 25.0000 mg | Freq: Four times a day (QID) | RECTAL | Status: AC | PRN

## 2012-08-19 MED ORDER — PROMETHAZINE HCL 12.5 MG PO TABS: 12.5000 mg | ORAL_TABLET | Freq: Four times a day (QID) | ORAL | Status: AC | PRN

## 2012-08-19 NOTE — Telephone Encounter (Signed)
Former Training and development officer patient.  Those patients can get PRN nausea meds indefinitely to avoid a retching episode that will make the Hiatal hernia recurr & wrap slip/loosen:    Phenergan 12.5mg  PO q6h PRN pain #20 refill = 5.   Phenergan supp 25mg  PR q6h PRN #3 refill =5

## 2012-08-19 NOTE — Telephone Encounter (Signed)
Spoke to patient and made him aware refills on phenergan ok per Dr Michaell Cowing. Phenergan tablets and suppositories E-prescribed to [facility] on [address] per patient's request. He will call with any problems.

## 2014-08-20 ENCOUNTER — Telehealth (INDEPENDENT_AMBULATORY_CARE_PROVIDER_SITE_OTHER): Payer: Self-pay

## 2014-08-20 DIAGNOSIS — R1314 Dysphagia, pharyngoesophageal phase: Secondary | ICD-10-CM

## 2014-08-20 NOTE — Telephone Encounter (Signed)
Pt in office to see  Dr Michaell CowingGross today for dysphagia [facility]

## 2014-08-22 ENCOUNTER — Ambulatory Visit
Admission: RE | Admit: 2014-08-22 | Discharge: 2014-08-22 | Disposition: A | Discharge status: Home / Self Care | Payer: BC Managed Care – PPO | Source: Ambulatory Visit | Attending: Surgery | Admitting: Surgery

## 2014-08-22 ENCOUNTER — Encounter (INDEPENDENT_AMBULATORY_CARE_PROVIDER_SITE_OTHER): Payer: Self-pay

## 2014-08-22 ENCOUNTER — Other Ambulatory Visit (INDEPENDENT_AMBULATORY_CARE_PROVIDER_SITE_OTHER): Payer: Self-pay | Admitting: Surgery

## 2014-08-22 DIAGNOSIS — R1314 Dysphagia, pharyngoesophageal phase: Secondary | ICD-10-CM

## 2014-09-10 ENCOUNTER — Ambulatory Visit (AMBULATORY_SURGERY_CENTER): Payer: Self-pay | Admitting: *Deleted

## 2014-09-10 VITALS — Ht 77.0 in | Wt 280.2 lb

## 2014-09-10 DIAGNOSIS — R1310 Dysphagia, unspecified: Secondary | ICD-10-CM

## 2014-09-10 NOTE — Progress Notes (Signed)
No egg or soy allergy. ewm No home 02 use. ewm No blood thinners. ewm No problems with past sedation. ewm Pt declined emmi. ewm

## 2014-09-17 ENCOUNTER — Ambulatory Visit (AMBULATORY_SURGERY_CENTER): Payer: BC Managed Care – PPO | Admitting: Internal Medicine

## 2014-09-17 ENCOUNTER — Encounter: Payer: Self-pay | Admitting: Internal Medicine

## 2014-09-17 VITALS — BP 122/74 | HR 69 | Temp 95.3°F | Resp 14 | Ht 77.0 in | Wt 280.0 lb

## 2014-09-17 DIAGNOSIS — R933 Abnormal findings on diagnostic imaging of other parts of digestive tract: Secondary | ICD-10-CM

## 2014-09-17 DIAGNOSIS — R1314 Dysphagia, pharyngoesophageal phase: Secondary | ICD-10-CM

## 2014-09-17 DIAGNOSIS — K2970 Gastritis, unspecified, without bleeding: Secondary | ICD-10-CM

## 2014-09-17 DIAGNOSIS — R1310 Dysphagia, unspecified: Secondary | ICD-10-CM

## 2014-09-17 DIAGNOSIS — K2990 Gastroduodenitis, unspecified, without bleeding: Secondary | ICD-10-CM

## 2014-09-17 DIAGNOSIS — R1319 Other dysphagia: Secondary | ICD-10-CM

## 2014-09-17 MED ORDER — SODIUM CHLORIDE 0.9 % IV SOLN: 500.0000 mL | INTRAVENOUS | Status: DC

## 2014-09-17 NOTE — Progress Notes (Signed)
Report to PACU, RN, vss, BBS= Clear.  

## 2014-09-17 NOTE — Op Note (Signed)
[facility] [address] [place], [id number]   ENDOSCOPY PROCEDURE REPORT  PATIENT: Alec DickerHenderson, Bucky L  MR#: [id number] BIRTHDATE: 1971-01-11 , 43  yrs. old GENDER: male ENDOSCOPIST: Beverley FiedlerJay M Pyrtle, MD ASSISTANT:   Karl BalesKristen Westbrook, RN REFERRED YQ:[id number]:Steven Gross, M.D. PROCEDURE DATE:  09/17/2014 PROCEDURE:   EGD with biopsy and EGD with balloon dilatation ASA CLASS:   Class II INDICATIONS:dysphagia.   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  The LB [id number]  endoscope was introduced through the mouth  and advanced to the second portion of the duodenum ,      The instrument was slowly withdrawn as the mucosa was carefully examined.  ESOPHAGUS: The mucosa of the esophagus appeared normal.  Inlet patch again seen.  GE junction appears patent and no resistance encountered with passage of the scope.  Given prior Nissen fundoplication, esophageal dysphagia, and barium swallow findings, balloon dilation was performed across the GE junction. No resistance with the 15 mm and 16.5 mm balloon, thus the balloon was insufflated to 18 mm and held for 1 minute.  STOMACH: Gastritis (inflammation) with scant heme was found in the gastric antrum.  Multiple biopsies were performed using cold forceps taken from the gastric body, antrum and incisura. Retroflexion revealed intact prior Nissen fundoplication.  DUODENUM: The duodenal mucosa showed no abnormalities in the bulb and second portion of the duodenum.  Dilator:Balloon Size:18 mm  Resistance:minimal Heme:none  COMPLICATIONS: There were no immediate complications.  ENDOSCOPIC IMPRESSION: 1.   The mucosa of the  esophagus appeared normal; dilation performed across the GE junction to 18 mm 2.   Gastritis (inflammation) was found in the gastric antrum; multiple biopsies 3.   Prior Nissen fundoplication, intact 3.   The duodenal mucosa showed no abnormalities in the bulb and second portion of the duodenum  RECOMMENDATIONS: 1.  Await biopsy results 2.  Await response to dilation.  Office follow-up as needed  eSigned:  Beverley FiedlerJay M Pyrtle, MD 09/17/2014 10:20 AM     XL:[id number]:Steven Michaell CowingGross, MD, The Patient, and Duane LopeAlan Ross, MD      PATIENT NAME:  Alec DickerHenderson, Petar L MR#: [id number]

## 2014-09-17 NOTE — Progress Notes (Signed)
Called to room to assist during endoscopic procedure.  Patient ID and intended procedure confirmed with present staff. Received instructions for my participation in the procedure from the performing physician.  

## 2014-09-17 NOTE — Patient Instructions (Signed)

## 2014-09-18 ENCOUNTER — Telehealth: Payer: Self-pay | Admitting: *Deleted

## 2014-09-18 NOTE — Telephone Encounter (Signed)
  Follow up Call-  Call back number 09/17/2014  Post procedure Call Back phone  # [phone]  Permission to leave phone message Yes     Patient questions:  Do you have a fever, pain , or abdominal swelling? No. Pain Score  0 *  Have you tolerated food without any problems? Yes.    Have you been able to return to your normal activities? Yes.    Do you have any questions about your discharge instructions: Diet   No. Medications  No. Follow up visit              No  Do you have questions or concerns about your Care? No.  Actions: * If pain score is 4 or above: No action needed, pain <4.

## 2014-09-24 ENCOUNTER — Encounter: Payer: Self-pay | Admitting: Internal Medicine

## 2017-03-17 ENCOUNTER — Encounter: Payer: PRIVATE HEALTH INSURANCE | Attending: Family Medicine | Admitting: *Deleted

## 2017-03-17 DIAGNOSIS — Z713 Dietary counseling and surveillance: Secondary | ICD-10-CM | POA: Insufficient documentation

## 2017-03-17 DIAGNOSIS — E119 Type 2 diabetes mellitus without complications: Secondary | ICD-10-CM

## 2017-03-17 NOTE — Patient Instructions (Signed)
Plan:  Aim for 4 Carb Choices per meal (60 grams) +/- 1 either way  Aim for 0-2 Carbs per snack if hungry  Include protein in moderation with your meals and snacks Consider reading food labels for Total Carbohydrate of foods Continue with your activity level as tolerated Consider checking BG at alternate times per day  Continue  taking medication as directed by MD

## 2017-03-18 NOTE — Progress Notes (Signed)
Diabetes Self-Management Education  Visit Type: First/Initial  Appt. Start Time: 1530 Appt. End Time: 1700  03/18/2017  [name], identified by name and date of birth, is a 46 y.o. male with a diagnosis of Diabetes: Type 2. He is here with his husband who participated in the visit. He states strong family history of Diabetes with some relatives having severe complications from Diabetes so he wants to avoid that for himself. He currently walks daily and his diet history was obtained. He already has a meter and is checking daily, typically after the supper meal. 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 He has lost over 6 pounds in the past 2-3 weeks since diagnosis.  ASSESSMENT  Height 6\' 5"  (1.956 m), weight 262 lb 6.4 oz (119 kg). Body mass index is 31.12 kg/m.      Diabetes Self-Management Education - 03/17/17 1533      Visit Information   Visit Type First/Initial     Initial Visit   Diabetes Type Type 2   Are you currently following a meal plan? No   Are you taking your medications as prescribed? Yes   Date Diagnosed 3 weeks ago     Health Coping   How would you rate your overall health? Good     Psychosocial Assessment   Patient Belief/Attitude about Diabetes Motivated to manage diabetes   Self-care barriers None   Self-management support Family   Other persons present Patient;Spouse/SO   Patient Concerns Glycemic Control;Nutrition/Meal planning   Special Needs None   Preferred Learning Style Auditory;Hands on   Learning Readiness Ready   How often do you need to have someone help you when you read instructions, pamphlets, or other written materials from your doctor or pharmacy? 1 - Never   What is the last grade level you completed in school? junior in college     Pre-Education Assessment   Patient understands the diabetes disease and treatment process. Needs Instruction   Patient  understands incorporating nutritional management into lifestyle. Needs Instruction   Patient undertands incorporating physical activity into lifestyle. Needs Review   Patient understands using medications safely. Needs Instruction   Patient understands monitoring blood glucose, interpreting and using results Needs Review   Patient understands prevention, detection, and treatment of acute complications. Needs Instruction   Patient understands prevention, detection, and treatment of chronic complications. Needs Instruction   Patient understands how to develop strategies to address psychosocial issues. Needs Review   Patient understands how to develop strategies to promote health/change behavior. Needs Review     Complications   Last HgB A1C per patient/outside source 11.7 %   How often do you check your blood sugar? 1-2 times/day   Postprandial Blood glucose range (mg/dL) 086-578;46-962   Number of hypoglycemic episodes per month 0   Have you had a dilated eye exam in the past 12 months? Yes   Have you had a dental exam in the past 12 months? Yes   Are you checking your feet? Yes   How many days per week are you checking your feet? 7     Dietary Intake   Breakfast casserole with eggs, mushrooms, peppers, sausage or bacon included  OR meat salad   Snack (morning) occasionally banana or nuts   Lunch left overs from dinner: meat and vegetables usually   Snack (afternoon) occasionally, same as AM   Dinner zucchini noodles with meat, avoiding starches now  Snack (evening) nuts if anything   Beverage(s) black coffee, unsweet tea,      Exercise   Exercise Type Light (walking / raking leaves)  walks dog every AM for 2 miles, has added an evening walk of 1/2 mile,    How many days per week to you exercise? 7   How many minutes per day do you exercise? 60   Total minutes per week of exercise 420     Patient Education   Previous Diabetes Education No   Disease state  Definition of diabetes,  type 1 and 2, and the diagnosis of diabetes   Nutrition management  Role of diet in the treatment of diabetes and the relationship between the three main macronutrients and blood glucose level;Food label reading, portion sizes and measuring food.;Carbohydrate counting   Physical activity and exercise  Role of exercise on diabetes management, blood pressure control and cardiac health.   Medications Reviewed patients medication for diabetes, action, purpose, timing of dose and side effects.   Monitoring Identified appropriate SMBG and/or A1C goals.   Chronic complications Relationship between chronic complications and blood glucose control   Psychosocial adjustment Role of stress on diabetes     Individualized Goals (developed by patient)   Nutrition Follow meal plan discussed   Physical Activity Exercise 5-7 days per week   Medications take my medication as prescribed   Monitoring  test blood glucose pre and post meals as discussed     Post-Education Assessment   Patient understands the diabetes disease and treatment process. Demonstrates understanding / competency   Patient understands incorporating nutritional management into lifestyle. Demonstrates understanding / competency   Patient undertands incorporating physical activity into lifestyle. Demonstrates understanding / competency   Patient understands using medications safely. Demonstrates understanding / competency   Patient understands monitoring blood glucose, interpreting and using results Demonstrates understanding / competency   Patient understands prevention, detection, and treatment of acute complications. Demonstrates understanding / competency   Patient understands prevention, detection, and treatment of chronic complications. Demonstrates understanding / competency   Patient understands how to develop strategies to address psychosocial issues. Demonstrates understanding / competency   Patient understands how to develop  strategies to promote health/change behavior. Demonstrates understanding / competency     Outcomes   Expected Outcomes Demonstrated interest in learning. Expect positive outcomes   Future DMSE 2 months   Program Status Completed      Individualized Plan for Diabetes Self-Management Training:   Learning Objective:  Patient will have a greater understanding of diabetes self-management. Patient education plan is to attend individual and/or group sessions per assessed needs and concerns.   Plan:   Patient Instructions  Plan:  Aim for 4 Carb Choices per meal (60 grams) +/- 1 either way  Aim for 0-2 Carbs per snack if hungry  Include protein in moderation with your meals and snacks Consider reading food labels for Total Carbohydrate of foods Continue with your activity level as tolerated Consider checking BG at alternate times per day  Continue  taking medication as directed by MD  Expected Outcomes:  Demonstrated interest in learning. Expect positive outcomes  Education material provided: Living Well with Diabetes, A1C conversion sheet, Meal plan card, Snack sheet and Carbohydrate counting sheet  If problems or questions, patient to contact team via:  Phone  Future DSME appointment: 2 months

## 2017-05-17 ENCOUNTER — Ambulatory Visit: Payer: Self-pay | Admitting: Podiatry

## 2017-05-18 ENCOUNTER — Ambulatory Visit: Payer: Self-pay | Admitting: *Deleted

## 2017-05-27 ENCOUNTER — Ambulatory Visit (INDEPENDENT_AMBULATORY_CARE_PROVIDER_SITE_OTHER): Payer: 59 | Admitting: Podiatry

## 2017-05-27 ENCOUNTER — Ambulatory Visit (INDEPENDENT_AMBULATORY_CARE_PROVIDER_SITE_OTHER): Payer: 59

## 2017-05-27 ENCOUNTER — Encounter: Payer: Self-pay | Admitting: Podiatry

## 2017-05-27 DIAGNOSIS — M722 Plantar fascial fibromatosis: Secondary | ICD-10-CM | POA: Diagnosis not present

## 2017-05-27 DIAGNOSIS — M79672 Pain in left foot: Secondary | ICD-10-CM

## 2017-05-27 DIAGNOSIS — M779 Enthesopathy, unspecified: Secondary | ICD-10-CM

## 2017-05-27 MED ORDER — TRIAMCINOLONE ACETONIDE 10 MG/ML IJ SUSP
10.0000 mg | Freq: Once | INTRAMUSCULAR | Status: AC
  Administered 2017-05-27: 10 mg

## 2017-05-27 NOTE — Progress Notes (Signed)
   Subjective:    Patient ID: Alec DickerAdam L Schedler, male    DOB: May 09, 1971, 46 y.o.   MRN: [id number]  HPI   I have some left heel pain and hurts with standing and has ben going on for about 6 months and I am a diabetic and my A1C was 12 back in may and hurts on bottom and goes up the back of my left heel     Review of Systems  Constitutional: Positive for activity change and unexpected weight change.  Musculoskeletal:       Joint pain on knees  All other systems reviewed and are negative.      Objective:   Physical Exam        Assessment & Plan:

## 2017-05-27 NOTE — Patient Instructions (Signed)

## 2017-05-27 NOTE — Progress Notes (Signed)
Subjective:    Patient ID: [name], male   DOB: 46 y.o.   MRN: [id number]   HPI [name] presents to the office today for concerns of left heel pain which has been ongoing for about 6 months. He states that he was having pain he saw his primary care physician and he had blood work done this is found he was diabetic. He states the pain to the bottom of the left heels intermittent discomfort a throbbing sensation. He denies any recent injury or trauma. No numbness or tingling. The pain does not wake him up at night. He's been taking Voltaren as well as other anti-inflammatories which is been helping. No other treatment. No other complaints today.   Review of Systems  All other systems reviewed and are negative.       Objective:  Physical Exam General: AAO x3, NAD  Dermatological: Skin is warm, dry and supple bilateral. Nails x 10 are well manicured; remaining integument appears unremarkable at this time. There are no open sores, no preulcerative lesions, no rash or signs of infection present.  Vascular: Dorsalis Pedis artery and Posterior Tibial artery pedal pulses are 2/4 bilateral with immedate capillary fill time. Pedal hair growth present. No varicosities and no lower extremity edema present bilateral. There is no pain with calf compression, swelling, warmth, erythema.   Neruologic: Grossly intact via light touch bilateral.  Protective threshold with Semmes Wienstein monofilament intact to all pedal sites bilateral.   Musculoskeletal: Tenderness to palpation along the plantar medial tubercle of the calcaneus at the insertion of plantar fascia on the left foot. There is no pain along the course of the plantar fascia within the arch of the foot. Plantar fascia appears to be intact. There is no pain with lateral compression of the calcaneus or pain with vibratory sensation. There is no pain along the course or insertion of the achilles tendon. No other areas of tenderness to  bilateral lower extremities. There is no pain in the dorsal aspect of the right foot or other areas of the right foot. Small bone spur present on the dorsal midfoot on the right side. Muscular strength 5/5 in all groups tested bilateral.  Gait: Unassisted, Nonantalgic.      Assessment:     46 year old male left heel pain, plantar fasciitis with small inferior calcaneal spur present.    Plan:     -Treatment options discussed including all alternatives, risks, and complications -Etiology of symptoms were discussed -X-rays were obtained and reviewed with the patient. Small inferior and posterior calcaneal spurring is present. No evidence of acute fracture identified today. -Patient elects to proceed with steroid injection into the left heel. Under sterile skin preparation, a total of 2.5cc of kenalog 10, 0.5% Marcaine plain, and 2% lidocaine plain were infiltrated into the symptomatic area without complication. A band-aid was applied. Patient tolerated the injection well without complication. Post-injection care with discussed with the patient. Discussed with the patient to ice the area over the next couple of days to help prevent a steroid flare.  -Plantar fascial brace was dispensed -Continue Voltaren prn -Stretching, icing exercises daily. Also discuss contrast baths -Discussed shoe gear modifications and orthotics -Follow-up in 3 weeks or sooner if any problems arise. In the meantime, encouraged to call the office with any questions, concerns, change in symptoms.   Ovid CurdMatthew Shavona Gunderman, DPM

## 2017-06-18 ENCOUNTER — Encounter: Payer: 59 | Attending: Family Medicine | Admitting: *Deleted

## 2017-06-18 DIAGNOSIS — Z713 Dietary counseling and surveillance: Secondary | ICD-10-CM | POA: Diagnosis not present

## 2017-06-18 DIAGNOSIS — E119 Type 2 diabetes mellitus without complications: Secondary | ICD-10-CM | POA: Insufficient documentation

## 2017-06-18 NOTE — Progress Notes (Signed)
Diabetes Self-Management Education  Visit Type:  Follow-up  Appt. Start Time: 0800 Appt. End Time: 0830  06/18/2017  [name], identified by name and date of birth, is a 46 y.o. male with a diagnosis of Diabetes: Type 2.  He states he is feeling better, FBG running between 98-125 mg/dl daily, clothes are fitting better even with moderate increase in weight over past 3 months. He has been traveling excessively with his job but making healthier choices with food and activity choices. No questions or problems offered today. Patient plans to have A1c done within the next week and I have asked him to let me know the results.   ASSESSMENT  Height 6\' 5"  (1.956 m), weight 273 lb 8 oz (124.1 kg). Body mass index is 32.43 kg/m.       Diabetes Self-Management Education - 06/18/17 0841      Health Coping   How would you rate your overall health? Good     Psychosocial Assessment   Patient Belief/Attitude about Diabetes Motivated to manage diabetes   Self-care barriers None   Self-management support Family   Patient Concerns Nutrition/Meal planning;Healthy Lifestyle;Glycemic Control   Special Needs None   Learning Readiness Change in progress     Complications   How often do you check your blood sugar? 1-2 times/day   Fasting Blood glucose range (mg/dL) [phone]   Number of hypoglycemic episodes per month 0     Exercise   Exercise Type Light (walking / raking leaves)   How many days per week to you exercise? 7   How many minutes per day do you exercise? 45   Total minutes per week of exercise 315     Patient Education   Previous Diabetes Education Yes (please comment)  3 months ago, here     Patient Self-Evaluation of Goals - Patient rates self as meeting previously set goals (% of time)   Nutrition >75%   Physical Activity >75%   Medications >75%   Monitoring >75%   Problem Solving >75%   Reducing Risk >75%   Health Coping >75%     Outcomes   Program Status Completed      Subsequent Visit   Since your last visit, are you checking your blood glucose at least once a day? Yes     Learning Objective:  Patient will have a greater understanding of diabetes self-management. Patient education plan is to attend individual and/or group sessions per assessed needs and concerns.  Plan:   Patient Instructions  Plan:  Aim for 4 Carb Choices per meal (60 grams) +/- 1 either way  Aim for 0-2 Carbs per snack if hungry  Include protein in moderation with your meals and snacks Continue reading food labels for Total Carbohydrate of foods Continue with your activity level as tolerated Consider checking BG at alternate times per day  Continue  taking medication as directed by MD  Expected Outcomes:  Demonstrated interest in learning. Expect positive outcomes  Education material provided: A1C conversion sheet  If problems or questions, patient to contact team via:  Phone  Future DSME appointment: - PRN

## 2017-06-18 NOTE — Patient Instructions (Signed)
Plan:  Aim for 4 Carb Choices per meal (60 grams) +/- 1 either way  Aim for 0-2 Carbs per snack if hungry  Include protein in moderation with your meals and snacks Continue reading food labels for Total Carbohydrate of foods Continue with your activity level as tolerated Consider checking BG at alternate times per day  Continue  taking medication as directed by MD

## 2017-06-25 ENCOUNTER — Ambulatory Visit (INDEPENDENT_AMBULATORY_CARE_PROVIDER_SITE_OTHER): Payer: 59 | Admitting: Podiatry

## 2017-06-25 ENCOUNTER — Encounter: Payer: Self-pay | Admitting: Podiatry

## 2017-06-25 DIAGNOSIS — M722 Plantar fascial fibromatosis: Secondary | ICD-10-CM

## 2017-06-25 DIAGNOSIS — E119 Type 2 diabetes mellitus without complications: Secondary | ICD-10-CM | POA: Diagnosis not present

## 2017-06-25 MED ORDER — TRIAMCINOLONE ACETONIDE 10 MG/ML IJ SUSP
10.0000 mg | Freq: Once | INTRAMUSCULAR | Status: AC
  Administered 2017-06-25: 10 mg

## 2017-06-25 NOTE — Progress Notes (Signed)
Subjective: Alec DickerAdam L Madrazo presents to the office today for follow-up evaluation of left heel pain. They state that they are doing better. They have been stretching, try to wear supportive shoe as much as possible. He is also bothered compression sock that his been helping as well as using the plantar fascial brace. He has not been icing on a regular basis.No other complaints at this time. No acute changes since last appointment. They deny any systemic complaints such as fevers, chills, nausea, vomiting.  Objective: General: AAO x3, NAD  Dermatological: Skin is warm, dry and supple bilateral. Nails x 10 are well manicured; remaining integument appears unremarkable at this time. There are no open sores, no preulcerative lesions, no rash or signs of infection present.  Vascular: Dorsalis Pedis artery and Posterior Tibial artery pedal pulses are 2/4 bilateral with immedate capillary fill time. Pedal hair growth present. There is no pain with calf compression, swelling, warmth, erythema.   Neruologic: Grossly intact via light touch bilateral. Vibratory intact via tuning fork bilateral. Protective threshold with Semmes Wienstein monofilament intact to all pedal sites bilateral.   Musculoskeletal: There is improved but continued tenderness palpation along the plantar medial tubercle of the calcaneus at the insertion of the plantar fascia on the left foot. There is no pain along the course of the plantar fascia within the arch of the foot. Plantar fascia appears to be intact bilaterally. There is no pain with lateral compression of the calcaneus and there is no pain with vibratory sensation. There is no pain along the course or insertion of the Achilles tendon. There are no other areas of tenderness to bilateral lower extremities. No gross boney pedal deformities bilateral. No pain, crepitus, or limitation noted with foot and ankle range of motion bilateral. Muscular strength 5/5 in all groups tested  bilateral.  Gait: Unassisted, Nonantalgic.   Assessment: Presents for follow-up evaluation for heel pain, likely plantar fasciitis   Plan: -Treatment options discussed including all alternatives, risks, and complications -Patient elects to proceed with steroid injection into the left heel. Under sterile skin preparation, a total of 2.5cc of kenalog 10, 0.5% Marcaine plain, and 2% lidocaine plain were infiltrated into the symptomatic area without complication. A band-aid was applied. Patient tolerated the injection well without complication. Post-injection care with discussed with the patient. Discussed with the patient to ice the area over the next couple of days to help prevent a steroid flare.  -Discussed orthotics. He is instructed an over-the-counter insert. Also discussed a shoe gear change. -Ice and stretching exercises on a daily basis. -Continue supportive shoe gear. -Follow-up in 4 weeks if still having pain or sooner if any problems arise. In the meantime, encouraged to call the office with any questions, concerns, change in symptoms.   Ovid CurdMatthew Wagoner, DPM

## 2017-07-05 DIAGNOSIS — E7800 Pure hypercholesterolemia, unspecified: Secondary | ICD-10-CM | POA: Diagnosis not present

## 2017-07-05 DIAGNOSIS — E119 Type 2 diabetes mellitus without complications: Secondary | ICD-10-CM | POA: Diagnosis not present

## 2017-07-23 ENCOUNTER — Ambulatory Visit: Payer: 59 | Admitting: Podiatry

## 2017-09-30 ENCOUNTER — Ambulatory Visit (INDEPENDENT_AMBULATORY_CARE_PROVIDER_SITE_OTHER): Payer: 59

## 2017-09-30 ENCOUNTER — Ambulatory Visit (INDEPENDENT_AMBULATORY_CARE_PROVIDER_SITE_OTHER): Payer: 59 | Admitting: Podiatry

## 2017-09-30 DIAGNOSIS — M659 Synovitis and tenosynovitis, unspecified: Secondary | ICD-10-CM

## 2017-09-30 DIAGNOSIS — M722 Plantar fascial fibromatosis: Secondary | ICD-10-CM | POA: Diagnosis not present

## 2017-09-30 DIAGNOSIS — M779 Enthesopathy, unspecified: Secondary | ICD-10-CM | POA: Diagnosis not present

## 2017-09-30 NOTE — Patient Instructions (Signed)

## 2017-10-05 DIAGNOSIS — M659 Synovitis and tenosynovitis, unspecified: Secondary | ICD-10-CM | POA: Insufficient documentation

## 2017-10-05 NOTE — Progress Notes (Signed)
Subjective: [name] presents the office today for follow-up evaluation of left heel pain.  His left heel is doing better however he is try to get pain to the right back of the heel.  He denies any recent injury or trauma.  Denies any swelling or redness.  The pain does not wake him up at night.  He states the pain is worse in the morning he first gets up or after periods of rest does get better with activity.  Pain does not wake him up at night.  He has no other concerns today. Denies any systemic complaints such as fevers, chills, nausea, vomiting. No acute changes since last appointment, and no other complaints at this time.   Objective: AAO x3, NAD DP/PT pulses palpable bilaterally, CRT less than 3 seconds There is improved but yet still mild tenderness to palpation along the plantar medial tubercle of the calcaneus at the insertion of plantar fascia on the left foot. There is no pain along the course of the plantar fascia within the arch of the foot. Plantar fascia appears to be intact. There is no pain with lateral compression of the calcaneus or pain with vibratory sensation. There is no pain along the course or insertion of the achilles tendon on the left side.  There is mild discomfort on the right side on the posterior calcaneus and insertion of the Achilles tendon.  Achilles tendon appears to be intact.  Equinus is present. No other areas of tenderness to bilateral lower extremities. No open lesions or pre-ulcerative lesions.  No pain with calf compression, swelling, warmth, erythema  Assessment: Left plantar fasciitis with right insertional Achilles tendinitis  Plan: -All treatment options discussed with the patient including all alternatives, risks, complications.  -Regards to the left foot all the symptoms are much improved discussed another steroid injection and he wishes to proceed.  See procedure note below. -Night splint was dispensed -Discussed shoe gear modifications and  orthotics -If symptoms continue consider physical therapy -Follow-up as scheduled or sooner if needed -Patient encouraged to call the office with any questions, concerns, change in symptoms.   Procedure: Injection Tendon/Ligament Discussed alternatives, risks, complications and verbal consent was obtained.  Location: Left plantar fascia at the glabrous junction; medial approach. Skin Prep: Alcohol. Injectate: 1 cc 0.5% marcaine plain, 1 cc 0.5% Marcaine plain and, 1 cc kenalog 10. Disposition: Patient tolerated procedure well. Injection site dressed with a band-aid.  Post-injection care was discussed and return precautions discussed.   [name] DPM

## 2017-11-04 ENCOUNTER — Ambulatory Visit: Payer: 59 | Admitting: Podiatry

## 2018-01-21 DIAGNOSIS — M9905 Segmental and somatic dysfunction of pelvic region: Secondary | ICD-10-CM | POA: Diagnosis not present

## 2018-01-21 DIAGNOSIS — M9903 Segmental and somatic dysfunction of lumbar region: Secondary | ICD-10-CM | POA: Diagnosis not present

## 2018-01-21 DIAGNOSIS — M5441 Lumbago with sciatica, right side: Secondary | ICD-10-CM | POA: Diagnosis not present

## 2018-01-24 DIAGNOSIS — M5441 Lumbago with sciatica, right side: Secondary | ICD-10-CM | POA: Diagnosis not present

## 2018-01-24 DIAGNOSIS — M9905 Segmental and somatic dysfunction of pelvic region: Secondary | ICD-10-CM | POA: Diagnosis not present

## 2018-01-24 DIAGNOSIS — M9903 Segmental and somatic dysfunction of lumbar region: Secondary | ICD-10-CM | POA: Diagnosis not present

## 2018-01-31 DIAGNOSIS — M5441 Lumbago with sciatica, right side: Secondary | ICD-10-CM | POA: Diagnosis not present

## 2018-01-31 DIAGNOSIS — M9903 Segmental and somatic dysfunction of lumbar region: Secondary | ICD-10-CM | POA: Diagnosis not present

## 2018-01-31 DIAGNOSIS — M9905 Segmental and somatic dysfunction of pelvic region: Secondary | ICD-10-CM | POA: Diagnosis not present

## 2018-02-07 DIAGNOSIS — M9905 Segmental and somatic dysfunction of pelvic region: Secondary | ICD-10-CM | POA: Diagnosis not present

## 2018-02-07 DIAGNOSIS — M9903 Segmental and somatic dysfunction of lumbar region: Secondary | ICD-10-CM | POA: Diagnosis not present

## 2018-02-07 DIAGNOSIS — M5441 Lumbago with sciatica, right side: Secondary | ICD-10-CM | POA: Diagnosis not present

## 2018-02-14 DIAGNOSIS — M9903 Segmental and somatic dysfunction of lumbar region: Secondary | ICD-10-CM | POA: Diagnosis not present

## 2018-02-14 DIAGNOSIS — M9905 Segmental and somatic dysfunction of pelvic region: Secondary | ICD-10-CM | POA: Diagnosis not present

## 2018-02-14 DIAGNOSIS — M5441 Lumbago with sciatica, right side: Secondary | ICD-10-CM | POA: Diagnosis not present

## 2018-03-10 DIAGNOSIS — J029 Acute pharyngitis, unspecified: Secondary | ICD-10-CM | POA: Diagnosis not present

## 2018-03-11 DIAGNOSIS — M5441 Lumbago with sciatica, right side: Secondary | ICD-10-CM | POA: Diagnosis not present

## 2018-03-11 DIAGNOSIS — M9903 Segmental and somatic dysfunction of lumbar region: Secondary | ICD-10-CM | POA: Diagnosis not present

## 2018-03-11 DIAGNOSIS — M9905 Segmental and somatic dysfunction of pelvic region: Secondary | ICD-10-CM | POA: Diagnosis not present

## 2018-06-14 DIAGNOSIS — E119 Type 2 diabetes mellitus without complications: Secondary | ICD-10-CM | POA: Diagnosis not present

## 2018-06-14 DIAGNOSIS — E7800 Pure hypercholesterolemia, unspecified: Secondary | ICD-10-CM | POA: Diagnosis not present

## 2018-06-15 DIAGNOSIS — E119 Type 2 diabetes mellitus without complications: Secondary | ICD-10-CM | POA: Diagnosis not present

## 2018-06-15 DIAGNOSIS — E7800 Pure hypercholesterolemia, unspecified: Secondary | ICD-10-CM | POA: Diagnosis not present

## 2018-06-15 DIAGNOSIS — Z23 Encounter for immunization: Secondary | ICD-10-CM | POA: Diagnosis not present

## 2018-06-15 DIAGNOSIS — Z0000 Encounter for general adult medical examination without abnormal findings: Secondary | ICD-10-CM | POA: Diagnosis not present

## 2018-07-04 DIAGNOSIS — E119 Type 2 diabetes mellitus without complications: Secondary | ICD-10-CM | POA: Diagnosis not present

## 2018-08-15 DIAGNOSIS — R05 Cough: Secondary | ICD-10-CM | POA: Diagnosis not present

## 2018-08-15 DIAGNOSIS — J019 Acute sinusitis, unspecified: Secondary | ICD-10-CM | POA: Diagnosis not present

## 2018-12-29 DIAGNOSIS — E119 Type 2 diabetes mellitus without complications: Secondary | ICD-10-CM | POA: Diagnosis not present

## 2018-12-30 DIAGNOSIS — E1169 Type 2 diabetes mellitus with other specified complication: Secondary | ICD-10-CM | POA: Diagnosis not present

## 2018-12-30 DIAGNOSIS — E7800 Pure hypercholesterolemia, unspecified: Secondary | ICD-10-CM | POA: Diagnosis not present
